# Patient Record
Sex: Female | Born: 1990 | Race: White | Hispanic: No | Marital: Married | Smoking: Never smoker
Health system: Southern US, Community
[De-identification: ages and names within clinical notes are randomized; demographics above are authoritative.]

## PROBLEM LIST (undated history)

## (undated) DIAGNOSIS — F419 Anxiety disorder, unspecified: Secondary | ICD-10-CM

## (undated) DIAGNOSIS — F32A Depression, unspecified: Secondary | ICD-10-CM

## (undated) DIAGNOSIS — F329 Major depressive disorder, single episode, unspecified: Secondary | ICD-10-CM

---

## 2016-09-01 ENCOUNTER — Emergency Department (HOSPITAL_COMMUNITY)
Admission: EM | Admit: 2016-09-01 | Discharge: 2016-09-01 | Disposition: A | Discharge status: Home / Self Care | Attending: Emergency Medicine | Admitting: Emergency Medicine

## 2016-09-01 ENCOUNTER — Encounter (HOSPITAL_COMMUNITY): Payer: Self-pay | Admitting: Emergency Medicine

## 2016-09-01 DIAGNOSIS — F419 Anxiety disorder, unspecified: Secondary | ICD-10-CM | POA: Diagnosis not present

## 2016-09-01 DIAGNOSIS — F339 Major depressive disorder, recurrent, unspecified: Secondary | ICD-10-CM | POA: Insufficient documentation

## 2016-09-01 DIAGNOSIS — F329 Major depressive disorder, single episode, unspecified: Secondary | ICD-10-CM | POA: Diagnosis present

## 2016-09-01 HISTORY — DX: Depression, unspecified: F32.A

## 2016-09-01 HISTORY — DX: Major depressive disorder, single episode, unspecified: F32.9

## 2016-09-01 HISTORY — DX: Anxiety disorder, unspecified: F41.9

## 2016-09-01 LAB — COMPREHENSIVE METABOLIC PANEL
ALBUMIN: 4.2 g/dL (ref 3.5–5.0)
ALK PHOS: 40 U/L (ref 38–126)
ALT: 17 U/L (ref 14–54)
ANION GAP: 8 (ref 5–15)
AST: 19 U/L (ref 15–41)
BILIRUBIN TOTAL: 0.4 mg/dL (ref 0.3–1.2)
BUN: 8 mg/dL (ref 6–20)
CALCIUM: 9.7 mg/dL (ref 8.9–10.3)
CO2: 27 mmol/L (ref 22–32)
Chloride: 106 mmol/L (ref 101–111)
Creatinine, Ser: 0.63 mg/dL (ref 0.44–1.00)
GFR calc Af Amer: >60 mL/min (ref >60)
Glucose, Bld: 94 mg/dL (ref 65–99)
POTASSIUM: 4.2 mmol/L (ref 3.5–5.1)
Sodium: 141 mmol/L (ref 135–145)
TOTAL PROTEIN: 7.4 g/dL (ref 6.5–8.1)

## 2016-09-01 LAB — URINALYSIS, ROUTINE W REFLEX MICROSCOPIC
Bilirubin Urine: NEGATIVE
GLUCOSE, UA: NEGATIVE mg/dL
Ketones, ur: NEGATIVE mg/dL
Nitrite: NEGATIVE
PH: 5 (ref 5.0–8.0)
Protein, ur: NEGATIVE mg/dL
SPECIFIC GRAVITY, URINE: 1.023 (ref 1.005–1.030)

## 2016-09-01 LAB — CBC WITH DIFFERENTIAL/PLATELET
BASOS PCT: 0 %
Basophils Absolute: 0 10*3/uL (ref 0.0–0.1)
Eosinophils Absolute: 0.2 10*3/uL (ref 0.0–0.7)
Eosinophils Relative: 2 %
HEMATOCRIT: 39 % (ref 36.0–46.0)
HEMOGLOBIN: 13 g/dL (ref 12.0–15.0)
LYMPHS PCT: 28 %
Lymphs Abs: 2.7 10*3/uL (ref 0.7–4.0)
MCH: 28.8 pg (ref 26.0–34.0)
MCHC: 33.3 g/dL (ref 30.0–36.0)
MCV: 86.5 fL (ref 78.0–100.0)
MONO ABS: 0.6 10*3/uL (ref 0.1–1.0)
MONOS PCT: 6 %
NEUTROS ABS: 6.2 10*3/uL (ref 1.7–7.7)
NEUTROS PCT: 64 %
Platelets: 257 10*3/uL (ref 150–400)
RBC: 4.51 MIL/uL (ref 3.87–5.11)
RDW: 12.2 % (ref 11.5–15.5)
WBC: 9.6 10*3/uL (ref 4.0–10.5)

## 2016-09-01 LAB — ACETAMINOPHEN LEVEL: Acetaminophen (Tylenol), Serum: <10 ug/mL — ABNORMAL LOW (ref 10–30)

## 2016-09-01 LAB — HCG, SERUM, QUALITATIVE: Preg, Serum: NEGATIVE

## 2016-09-01 LAB — ETHANOL: Alcohol, Ethyl (B): <5 mg/dL (ref <5)

## 2016-09-01 MED ORDER — HYDROXYZINE HCL 25 MG PO TABS: 25.0000 mg | ORAL_TABLET | Freq: Two times a day (BID) | ORAL | 0 refills | Status: DC | PRN

## 2016-09-01 MED ORDER — FLUOXETINE HCL 10 MG PO TABS: 10.0000 mg | ORAL_TABLET | Freq: Every day | ORAL | 0 refills | Status: DC

## 2016-09-01 NOTE — BH Assessment (Signed)
Tele Assessment Note   Patricia Church is an 26 y.o. female presenting to the ED at the request of her PCP after she cut herself in an act of self-mutilation. The patient had been taking Prozac until about two months ago, took herself off and since then has increased depression and anxiety. The anxiety has increased to panic attacks, which led to the cutting episode. She has a history of cutting in 05/21/2009 when a friend died.  The patient denies SI with plan or intent, states more thoughts of escapism and self loathing. Denies HI or A/V. Denies illicit drug use. The patient lives in [place] with her husband and 6 yr old twin girls. 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   Her PCP called in Effexor for the patient a week ago but this medication was not covered under her insurance. Has not followed with provider until today about alternative medication. The patient first had symptoms of anxiety while pregnant but never panic to the level that she experience yesterday.  She could not focus on anything, felt like someone was sitting on her chest and started crying. The patient has good support system, lives near her parents, in-laws and her grandmother.   The patient is not connected with a psychiatrist or a therapist.  Had therapy for brief situational issues in the past. Expressed willingness to attend therapy. This clinician indicated the importance of learning coping skills due to the increased stress of upcoming nursing classes. Had unremarkable appearance, good eye contact, was alert, depressed mood, coherent thought, unimpaired judgement, oriented x4, normal concentration, fair appetite and decreased sleep.   Jinny Blossom, NP recommends to restart medications, preferably Prozac if that was effective in the past. And, begin therapy as soon as possible.  This clinician spoke with the EDP. He requested referrals for outpatient providers be faxed over  to the ED. Faxed referrals to [phone]   Diagnosis: MDD, recurrent episode moderate, without psychosis; GAD   Past Medical History:  Past Medical History:  Diagnosis Date  . Anxiety   . Depression     Past Surgical History:  Procedure Laterality Date  . CESAREAN SECTION      Family History: No family history on file.  Social History:  reports that she has never smoked. She does not have any smokeless tobacco history on file. She reports that she drinks alcohol. She reports that she does not use drugs.  Additional Social History:  Alcohol / Drug Use Pain Medications: see MAR Prescriptions: see MAR Over the Counter: see MAR History of alcohol / drug use?: Yes Substance #1 Name of Substance 1: alcohol 1 - Age of First Use: UTA 1 - Amount (size/oz): a few drinks 1 - Frequency: every other weekend with friends at a gathering 1 - Duration: UTA 1 - Last Use / Amount: UTA  CIWA: CIWA-Ar BP: 123/71 Pulse Rate: 90 COWS:    PATIENT STRENGTHS: (choose at least two) Average or above average intelligence General fund of knowledge  Allergies: No Known Allergies  Home Medications:  (Not in a hospital admission)  OB/GYN Status:  Patient's last menstrual period was 08/19/2016.  General Assessment Data Location of Assessment: [facility] TTS Assessment: In system Is this a Tele or Face-to-Face Assessment?: Tele Assessment Is this an Initial Assessment or a Re-assessment for this encounter?: Initial Assessment Marital status: Married Is patient pregnant?: No Pregnancy Status: No Living Arrangements: Spouse/significant other Can pt return to current  living arrangement?: Yes Admission Status: Voluntary Is patient capable of signing voluntary admission?: Yes Referral Source: Self/Family/Friend Insurance type: [place] Screening Exam ([place]) Medical Exam completed: Yes  Crisis Care Plan Living Arrangements: Spouse/significant other Name of Psychiatrist:  n/a Name of Therapist: n/a  Education Status Is patient currently in school?: No Highest grade of school patient has completed: some college  Risk to self with the past 6 months Suicidal Ideation: Yes-Currently Present Has patient been a risk to self within the past 6 months prior to admission? : No Suicidal Intent: No Has patient had any suicidal intent within the past 6 months prior to admission? : No Is patient at risk for suicide?: No Suicidal Plan?: No Has patient had any suicidal plan within the past 6 months prior to admission? : No Access to Means: No What has been your use of drugs/alcohol within the last 12 months?: occasional use of alcohol  Previous Attempts/Gestures: No How many times?: 0 Other Self Harm Risks: 0 Intentional Self Injurious Behavior: Cutting Comment - Self Injurious Behavior: in the past, 2011 and most recently  Family Suicide History: Unknown Persecutory voices/beliefs?: No Depression: Yes Depression Symptoms: Insomnia, Feeling worthless/self pity Substance abuse history and/or treatment for substance abuse?: No Suicide prevention information given to non-admitted patients: Not applicable  Risk to Others within the past 6 months Homicidal Ideation: No Does patient have any lifetime risk of violence toward others beyond the six months prior to admission? : No Thoughts of Harm to Others: No Current Homicidal Intent: No Current Homicidal Plan: No Access to Homicidal Means: No History of harm to others?: No Assessment of Violence: None Noted Does patient have access to weapons?: No Criminal Charges Pending?: No Does patient have a court date: No Is patient on probation?: No  Psychosis Hallucinations: None noted Delusions: None noted  Mental Status Report Appearance/Hygiene: Unremarkable Eye Contact: Good Motor Activity: Freedom of movement Speech: Logical/coherent Level of Consciousness: Alert Mood: Depressed, Euthymic Affect:  Appropriate to circumstance Anxiety Level: Minimal Thought Processes: Coherent, Relevant Judgement: Unimpaired Orientation: Person, Place, Time, Situation Obsessive Compulsive Thoughts/Behaviors: None  Cognitive Functioning Concentration: Normal Memory: Recent Intact, Remote Intact IQ: Average Insight: Good Impulse Control: Fair Appetite: Fair Weight Loss: 0 Weight Gain: 0 Sleep: Decreased Total Hours of Sleep: 5 Vegetative Symptoms: None (would if she didnt have kids- stay in bed)  ADLScreening ([place]) Patient's cognitive ability adequate to safely complete daily activities?: Yes Patient able to express need for assistance with ADLs?: Yes Independently performs ADLs?: Yes (appropriate for developmental age)  Prior Inpatient Therapy Prior Inpatient Therapy: No  Prior Outpatient Therapy Prior Outpatient Therapy: Yes Prior Therapy Dates: limited, situational visits Prior Therapy Facilty/Provider(s): unknown  Reason for Treatment: loss, marriage therapy Does patient have an ACCT team?: No Does patient have Intensive In-House Services?  : No Does patient have [facility] services? : No Does patient have P4CC services?: No  ADL Screening (condition at time of admission) Patient's cognitive ability adequate to safely complete daily activities?: Yes Is the patient deaf or have difficulty hearing?: No Does the patient have difficulty seeing, even when wearing glasses/contacts?: No Does the patient have difficulty concentrating, remembering, or making decisions?: No Patient able to express need for assistance with ADLs?: Yes Does the patient have difficulty dressing or bathing?: No Independently performs ADLs?: Yes (appropriate for developmental age)       Abuse/Neglect Assessment (Assessment to be complete while patient is alone) Physical Abuse:  (UTA) Verbal Abuse:  (  UTA) Sexual Abuse:  (UTA)     Advance Directives (For Healthcare) Does Patient Have a  Medical Advance Directive?: No Would patient like information on creating a medical advance directive?: No - Patient declined    Additional Information 1:1 In Past 12 Months?: No CIRT Risk: No Elopement Risk: No Does patient have medical clearance?: Yes     Disposition:  Disposition Initial Assessment Completed for this Encounter: Yes Disposition of Patient: Outpatient treatment Type of outpatient treatment: Adult  [place] 09/01/2016 7:09 PM

## 2016-09-01 NOTE — ED Triage Notes (Signed)
Pt reports being off of her medications for depression and has been feeling very down and has been having panic attacks.  States she felt that she could come off of the medication is the reason she is no longer taking it.  Talked with her pcp who prescribed something else, but her insurance will not cover it.  This was 1 week ago.  States she cut her upper arm yesterday, but was not trying to kill self.  States she has thoughts of her family being better off without her, but not thoughts of killing herself.

## 2016-09-01 NOTE — Discharge Instructions (Signed)
Call one of the outpatient counseling centers recommended by the behavioral health evaluator. Start Prozac (Fluoxetine). Some people's depression can worsen when starting an antidepressant. Talk to your family/husband/counselor daily to assess your symptoms. At any time you feel that you are worsening, or feel at risk for suicide, return for re-evaluation. Hydroxyzine for anxiety.

## 2016-09-01 NOTE — ED Provider Notes (Signed)
[facility] DEPT Provider Note   CSN: [id number] Arrival date & time: 09/01/16  1506     History   Chief Complaint Chief Complaint  Patient presents with  . V70.1    HPI Patricia Church is a 26 y.o. female.Chief co plaint:  Depression, and anxiety  HPI 26 year old female with history of depression and anxiety. First started on antidepressants as a teenager when her best friend was killed. His never gone through counseling. States she was on Prozac for several years. It worked for her depression. At time she felt it may be making her anxiety worse. This was discontinued. She was placed on Zoloft in various doses which she felt never worked well for her.  She states that she has been depressed over the last several weeks. She does have some anxiety issues scheduled to start nursing school in about 1 month.  He states that over the last few days she's had more anxiety. She scratched her self on her arm a few days ago she states this was "just to feel some pain". She denies that this was a suicidal attempt. She was seen by her primary care physician last week and prescribed Effexor but [employer] insurance would not cover it.  S is a lot of concern about her young twins. She says with confidence, and her husband reiterates that they do not feel she is at risk to herself but wanted "to talk about something different.  Past Medical History:  Diagnosis Date  . Anxiety   . Depression     There are no active problems to display for this patient.   Past Surgical History:  Procedure Laterality Date  . CESAREAN SECTION      OB History    No data available       Home Medications    Prior to Admission medications   Medication Sig Start Date End Date Taking? Authorizing Provider  ibuprofen (ADVIL,MOTRIN) 200 MG tablet Take 200-400 mg by mouth every 6 (six) hours as needed for fever, headache or moderate pain.   Yes [provider]  levonorgestrel (MIRENA) 20 MCG/24HR  IUD 1 each by Intrauterine route once.   Yes [provider]  oxymetazoline (AFRIN NASAL SPRAY) 0.05 % nasal spray Place 1-2 sprays into both nostrils daily as needed for congestion.   Yes [provider]  FLUoxetine (PROZAC) 10 MG tablet Take 1 tablet (10 mg total) by mouth daily. 09/01/16   Tanna Furry, MD  hydrOXYzine (ATARAX/VISTARIL) 25 MG tablet Take 1 tablet (25 mg total) by mouth every 12 (twelve) hours as needed for anxiety. 09/01/16   Tanna Furry, MD    Family History No family history on file.  Social History Social History  Substance Use Topics  . Smoking status: Never Smoker  . Smokeless tobacco: Not on file  . Alcohol use Yes     Comment: occasional     Allergies   Patient has no known allergies.   Review of Systems Review of Systems  Constitutional: Negative for appetite change, chills, diaphoresis, fatigue and fever.  HENT: Negative for mouth sores, sore throat and trouble swallowing.   Eyes: Negative for visual disturbance.  Respiratory: Negative for cough, chest tightness, shortness of breath and wheezing.   Cardiovascular: Negative for chest pain.  Gastrointestinal: Negative for abdominal distention, abdominal pain, diarrhea, nausea and vomiting.  Endocrine: Negative for polydipsia, polyphagia and polyuria.  Genitourinary: Negative for dysuria, frequency and hematuria.  Musculoskeletal: Negative for gait problem.  Skin: Negative for color  change, pallor and rash.  Neurological: Negative for dizziness, syncope, light-headedness and headaches.  Hematological: Does not bruise/bleed easily.  Psychiatric/Behavioral: Positive for dysphoric mood and sleep disturbance. Negative for behavioral problems and confusion. The patient is nervous/anxious.      Physical Exam Updated Vital Signs BP 123/71 (BP Location: Right Arm)   Pulse 90   Temp 98 F (36.7 C) (Oral)   Resp 16   Ht 5\' 2"  (1.575 m)   Wt 65.8 kg (145 lb)   LMP 08/19/2016   SpO2 100%    BMI 26.52 kg/m   Physical Exam  Constitutional: She is oriented to person, place, and time. She appears well-developed and well-nourished. No distress.  HENT:  Head: Normocephalic.  Eyes: Pupils are equal, round, and reactive to light. Conjunctivae are normal. No scleral icterus.  Neck: Normal range of motion. Neck supple. No thyromegaly present.  Cardiovascular: Normal rate and regular rhythm.  Exam reveals no gallop and no friction rub.   No murmur heard. Pulmonary/Chest: Effort normal and breath sounds normal. No respiratory distress. She has no wheezes. She has no rales.  Abdominal: Soft. Bowel sounds are normal. She exhibits no distension. There is no tenderness. There is no rebound.  Musculoskeletal: Normal range of motion.  Neurological: She is alert and oriented to person, place, and time.  Skin: Skin is warm and dry. No rash noted.  Psychiatric: She has a normal mood and affect. Her behavior is normal.  Appropriate interaction. Good eye contact. Admits to poor sleep and poor appetite.     ED Treatments / Results  Labs (all labs ordered are listed, but only abnormal results are displayed) Labs Reviewed  ACETAMINOPHEN LEVEL - Abnormal; Notable for the following:       Result Value   Acetaminophen (Tylenol), Serum <10 (*)    All other components within normal limits  URINALYSIS, ROUTINE W REFLEX MICROSCOPIC - Abnormal; Notable for the following:    APPearance HAZY (*)    Hgb urine dipstick SMALL (*)    Leukocytes, UA TRACE (*)    Bacteria, UA RARE (*)    Squamous Epithelial / LPF 0-5 (*)    All other components within normal limits  CBC WITH DIFFERENTIAL/PLATELET  COMPREHENSIVE METABOLIC PANEL  HCG, SERUM, QUALITATIVE  ETHANOL    EKG  EKG Interpretation None       Radiology No results found.  Procedures Procedures (including critical care time)  Medications Ordered in ED Medications - No data to display   Initial Impression / Assessment and Plan /  ED Course  I have reviewed the triage vital signs and the nursing notes.  Pertinent labs & imaging results that were available during my care of the patient were reviewed by me and considered in my medical decision making (see chart for details).   per behavioral health evaluation no indication for psychiatric admission. I would agree that the patient does not seem eminent for self-harm. They recommended that she restart an antidepressant. I discussed this with her at length. She remembers doing well on Prozac several years ago other than some anxiety. We'll place her on Prozac. Today with her that some people can worsen depressive symptoms in the first week or 2. Asked her to have daily contact with her family, husband, counselor, her primary care physician. I asked her reevaluated me any worsening symptoms. This was expressed as understood concern by patient and husband. Will use hydroxyzine as needed but sparingly for anxiety. Astra contact her primary care physician  to arrange follow-up appointment. Multiple outpatient counseling centers were provided in the area by mouth. She was asked to contact one for evaluation.  Final Clinical Impressions(s) / ED Diagnoses   Final diagnoses:  Recurrent major depressive disorder, remission status unspecified (HCC)  Anxiety    New Prescriptions New Prescriptions   FLUOXETINE (PROZAC) 10 MG TABLET    Take 1 tablet (10 mg total) by mouth daily.   HYDROXYZINE (ATARAX/VISTARIL) 25 MG TABLET    Take 1 tablet (25 mg total) by mouth every 12 (twelve) hours as needed for anxiety.     Tanna Furry, MD 09/01/16 [phone]

## 2018-06-23 ENCOUNTER — Other Ambulatory Visit: Payer: Self-pay | Admitting: Certified Nurse Midwife

## 2018-06-23 ENCOUNTER — Other Ambulatory Visit (HOSPITAL_COMMUNITY): Payer: Self-pay | Admitting: Certified Nurse Midwife

## 2018-06-23 DIAGNOSIS — E221 Hyperprolactinemia: Secondary | ICD-10-CM

## 2018-06-29 ENCOUNTER — Ambulatory Visit (HOSPITAL_COMMUNITY)
Admission: RE | Admit: 2018-06-29 | Discharge: 2018-06-29 | Disposition: A | Discharge status: Home / Self Care | Source: Ambulatory Visit | Attending: Certified Nurse Midwife | Admitting: Certified Nurse Midwife

## 2018-06-29 ENCOUNTER — Other Ambulatory Visit: Payer: Self-pay

## 2018-06-29 DIAGNOSIS — E221 Hyperprolactinemia: Secondary | ICD-10-CM | POA: Diagnosis present

## 2018-06-29 MED ORDER — GADOBUTROL 1 MMOL/ML IV SOLN
6.0000 mL | Freq: Once | INTRAVENOUS | Status: AC | PRN
  Administered 2018-06-29: 6 mL via INTRAVENOUS

## 2018-09-05 ENCOUNTER — Ambulatory Visit (INDEPENDENT_AMBULATORY_CARE_PROVIDER_SITE_OTHER): Admitting: "Endocrinology

## 2018-09-05 ENCOUNTER — Other Ambulatory Visit: Payer: Self-pay

## 2018-09-05 ENCOUNTER — Encounter: Payer: Self-pay | Admitting: "Endocrinology

## 2018-09-05 VITALS — BP 101/71 | HR 85 | Ht 62.0 in | Wt 126.0 lb

## 2018-09-05 DIAGNOSIS — D352 Benign neoplasm of pituitary gland: Secondary | ICD-10-CM

## 2018-09-05 NOTE — Progress Notes (Signed)
Endocrinology Consult Note                                            09/05/2018, 7:15 PM   Subjective:    Patient ID: Patricia Church, female    DOB: October 28, 1990, PCP Practice, [facility]   Past Medical History:  Diagnosis Date  . Anxiety   . Depression    Past Surgical History:  Procedure Laterality Date  . CESAREAN SECTION     Social History   Socioeconomic History  . Marital status: Married    Spouse name: Not on file  . Number of children: Not on file  . Years of education: Not on file  . Highest education level: Not on file  Occupational History  . Not on file  Social Needs  . Financial resource strain: Not on file  . Food insecurity    Worry: Not on file    Inability: Not on file  . Transportation needs    Medical: Not on file    Non-medical: Not on file  Tobacco Use  . Smoking status: Never Smoker  . Smokeless tobacco: Never Used  Substance and Sexual Activity  . Alcohol use: Yes    Comment: occasional  . Drug use: No  . Sexual activity: Yes    Birth control/protection: I.U.D.  Lifestyle  . Physical activity    Days per week: Not on file    Minutes per session: Not on file  . Stress: Not on file  Relationships  . Social Herbalist on phone: Not on file    Gets together: Not on file    Attends religious service: Not on file    Active member of club or organization: Not on file    Attends meetings of clubs or organizations: Not on file    Relationship status: Not on file  Other Topics Concern  . Not on file  Social History Narrative  . Not on file   History reviewed. No pertinent family history. Outpatient Encounter Medications as of 09/05/2018  Medication Sig  . venlafaxine XR (EFFEXOR-XR) 150 MG 24 hr capsule Take by mouth daily.  . clonazePAM (KLONOPIN) 0.5 MG tablet 0.5 mg as needed.  . [DISCONTINUED] FLUoxetine (PROZAC) 10 MG tablet Take 1 tablet (10 mg total) by mouth daily.  . [DISCONTINUED] hydrOXYzine  (ATARAX/VISTARIL) 25 MG tablet Take 1 tablet (25 mg total) by mouth every 12 (twelve) hours as needed for anxiety.  . [DISCONTINUED] ibuprofen (ADVIL,MOTRIN) 200 MG tablet Take 200-400 mg by mouth every 6 (six) hours as needed for fever, headache or moderate pain.  . [DISCONTINUED] levonorgestrel (MIRENA) 20 MCG/24HR IUD 1 each by Intrauterine route once.  . [DISCONTINUED] oxymetazoline (AFRIN NASAL SPRAY) 0.05 % nasal spray Place 1-2 sprays into both nostrils daily as needed for congestion.   No facility-administered encounter medications on file as of 09/05/2018.    ALLERGIES: Allergies  Allergen Reactions  . Bupropion Rash    VACCINATION STATUS:  There is no immunization history on file for this patient.  HPI Patricia Church is 28 y.o. female who presents today with a medical history as above. she is being seen in consultation for pituitary microadenoma requested by her OB/GYN provider Dr. Santo Held. -She reports that she was initiated on Effexor 150 mg once daily for mood disorder 18 months ago.  Subsequently she started to have galactorrhea.  Not available to review, however she was also found to have hyperprolactinemia.  This led to MRI imaging of the brain which showed 5 mm pituitary adenoma. She noticed that the galactorrhea is decreasing and not spontaneous at this time.  She denies any prior thyroid dysfunction.  She underwent C-section in September 2016 with twin pregnancy.  She is planning another pregnancy in the future.  She is not actively working on it now. -She complains of heavy menstrual flow, however her menstrual cycles not regular.  She is working currently with her OB/GYN.  She reports some headache, no complaint of visual field deficit.   Review of Systems  Constitutional: no recent weight gain/loss, no fatigue, no subjective hyperthermia, no subjective hypothermia Eyes: no blurry vision, no xerophthalmia ENT: no sore throat, no nodules palpated in throat,  no dysphagia/odynophagia, no hoarseness Cardiovascular: no Chest Pain, no Shortness of Breath, no palpitations, no leg swelling Respiratory: no cough, no shortness of breath Gastrointestinal: no Nausea/Vomiting/Diarhhea Musculoskeletal: no muscle/joint aches Skin: no rashes Neurological: no tremors, no numbness, no tingling, no dizziness Psychiatric: no depression, no anxiety  Objective:    BP 101/71   Pulse 85   Ht 5\' 2"  (1.575 m)   Wt 126 lb (57.2 kg)   BMI 23.05 kg/m   Wt Readings from Last 3 Encounters:  09/05/18 126 lb (57.2 kg)  09/01/16 145 lb (65.8 kg)    Physical Exam  Constitutional: + BMI of 23,  not in acute distress, normal state of mind Eyes: PERRLA, EOMI, no exophthalmos ENT: moist mucous membranes, no gross thyromegaly, no gross cervical lymphadenopathy Cardiovascular: normal precordial activity, Regular Rate and Rhythm, no Murmur/Rubs/Gallops Respiratory:  adequate breathing efforts, no gross chest deformity, Clear to auscultation bilaterally Gastrointestinal: abdomen soft, Non -tender, No distension, Bowel Sounds present, no gross organomegaly Musculoskeletal: no gross deformities, strength intact in all four extremities Skin: moist, warm, no rashes Breast exam deferred until we get new set of labs for prolactin. Neurological: no tremor with outstretched hands, Deep tendon reflexes normal in bilateral lower extremities.   MRI of brain with and without contrast on Jun 29, 2018 ADDENDUM: Upon further consideration and review, there is a subtle soft tissue lesion measuring 5 x 3 x 3 mm positioned at the right superior aspect of the pituitary gland, at the superior and medial aspect of the right cavernous sinus. This is best appreciated on coronal T2 weighted sequence (series 7, image 17). Following contrast administration, lesion demonstrates relative hypoenhancement as compared to the adjacent gland (series 1202, image 2). Finding felt to be most consistent  with a small pituitary microadenoma, somewhat unusual in location at the margin of the gland itself. No invasion into the adjacent cavernous sinus. Lesion closely approximates the undersurface of the right optic chiasm without mass effect. Pituitary stalk is midline.   Electronically Signed   By: Jeannine Boga M.D.   On: 06/30/2018 00:17  Assessment & Plan:   1. Pituitary microadenoma [facility]) - Deandre Stansel  is being seen at a kind request of Practice, [facility]. - I have reviewed her available endocrine records and clinically evaluated the patient. - Based on these reviews, she has pituitary microadenoma.  This microadenoma will not need any surgical intervention at this time. -It is unlikely to be a source of significant hyperprolactinemia in her case .  - she will need a repeat,  more complete evaluation with repeat prolactin, thyroid function tests towards assessing the functional  status of the pituitary microadenoma.     If she continues to have hyperprolactinemia, she will be  treated with cabergoline.   - I did not initiate any new prescriptions today.  -She will be sent to lab for these labs:  TSH - T4, free - Prolactin - CBC with Differential/Platelet  - Time spent with the patient: 45 minutes, of which >50% was spent in obtaining information about her symptoms, reviewing her previous labs/studies,  evaluations, and treatments, counseling her about her pituitary microadenoma and history of hyperprolactinemia, and developing a plan to confirm the diagnosis and long term treatment based on the latest standards of care/guidelines.    Patricia Church participated in the discussions, expressed understanding, and voiced agreement with the above plans.  All questions were answered to her satisfaction. she is encouraged to contact clinic should she have any questions or concerns prior to her return visit.  Follow up plan: Return in about 10 days (around  09/15/2018), or labs today, also get her labs from Ob/Gyn, for Follow up with Pre-visit Labs.   Glade Lloyd, MD [facility] [address] [place], [place] [address] Phone: [phone]  Fax: [phone]     09/05/2018, 7:15 PM  This note was partially dictated with voice recognition software. Similar sounding words can be transcribed inadequately or may not  be corrected upon review.

## 2018-09-06 LAB — CBC WITH DIFFERENTIAL/PLATELET
Absolute Monocytes: 462 cells/uL (ref 200–950)
Basophils Absolute: 28 cells/uL (ref 0–200)
Basophils Relative: 0.4 %
Eosinophils Absolute: 189 cells/uL (ref 15–500)
Eosinophils Relative: 2.7 %
HCT: 38.3 % (ref 35.0–45.0)
Hemoglobin: 12.9 g/dL (ref 11.7–15.5)
Lymphs Abs: 2471 cells/uL (ref 850–3900)
MCH: 28.6 pg (ref 27.0–33.0)
MCHC: 33.7 g/dL (ref 32.0–36.0)
MCV: 84.9 fL (ref 80.0–100.0)
MPV: 10.9 fL (ref 7.5–12.5)
Monocytes Relative: 6.6 %
Neutro Abs: 3850 cells/uL (ref 1500–7800)
Neutrophils Relative %: 55 %
Platelets: 258 10*3/uL (ref 140–400)
RBC: 4.51 10*6/uL (ref 3.80–5.10)
RDW: 12.4 % (ref 11.0–15.0)
Total Lymphocyte: 35.3 %
WBC: 7 10*3/uL (ref 3.8–10.8)

## 2018-09-06 LAB — T4, FREE: Free T4: 0.9 ng/dL (ref 0.8–1.8)

## 2018-09-06 LAB — TSH: TSH: 1.83 mIU/L

## 2018-09-06 LAB — PROLACTIN: Prolactin: 8.2 ng/mL

## 2018-09-19 ENCOUNTER — Encounter: Payer: Self-pay | Admitting: "Endocrinology

## 2018-09-19 ENCOUNTER — Other Ambulatory Visit: Payer: Self-pay

## 2018-09-19 ENCOUNTER — Ambulatory Visit (INDEPENDENT_AMBULATORY_CARE_PROVIDER_SITE_OTHER): Admitting: "Endocrinology

## 2018-09-19 VITALS — BP 112/76 | HR 98 | Wt 125.0 lb

## 2018-09-19 DIAGNOSIS — D352 Benign neoplasm of pituitary gland: Secondary | ICD-10-CM | POA: Diagnosis not present

## 2018-09-19 NOTE — Progress Notes (Signed)
09/19/2018, 5:36 PM  Endocrinology follow-up note   Subjective:    Patient ID: Patricia Church, female    DOB: August 14, 1990, PCP Practice, [facility]   Past Medical History:  Diagnosis Date  . Anxiety   . Depression    Past Surgical History:  Procedure Laterality Date  . CESAREAN SECTION     Social History   Socioeconomic History  . Marital status: Married    Spouse name: Not on file  . Number of children: Not on file  . Years of education: Not on file  . Highest education level: Not on file  Occupational History  . Not on file  Social Needs  . Financial resource strain: Not on file  . Food insecurity    Worry: Not on file    Inability: Not on file  . Transportation needs    Medical: Not on file    Non-medical: Not on file  Tobacco Use  . Smoking status: Never Smoker  . Smokeless tobacco: Never Used  Substance and Sexual Activity  . Alcohol use: Yes    Comment: occasional  . Drug use: No  . Sexual activity: Yes    Birth control/protection: I.U.D.  Lifestyle  . Physical activity    Days per week: Not on file    Minutes per session: Not on file  . Stress: Not on file  Relationships  . Social Herbalist on phone: Not on file    Gets together: Not on file    Attends religious service: Not on file    Active member of club or organization: Not on file    Attends meetings of clubs or organizations: Not on file    Relationship status: Not on file  Other Topics Concern  . Not on file  Social History Narrative  . Not on file   History reviewed. No pertinent family history. Outpatient Encounter Medications as of 09/19/2018  Medication Sig  . clonazePAM (KLONOPIN) 0.5 MG tablet 0.5 mg as needed.  . venlafaxine XR (EFFEXOR-XR) 150 MG 24 hr capsule Take by mouth daily.   No facility-administered encounter medications on file as of 09/19/2018.    ALLERGIES: Allergies  Allergen Reactions  .  Bupropion Rash    VACCINATION STATUS:  There is no immunization history on file for this patient.  HPI Patricia Church is 28 y.o. female who presents today with a medical history as above. she is returning for follow-up with repeat labs after she was seen in consultation for pituitary microadenoma requested by her OB/GYN provider Dr. Santo Held. -She reports that she was initiated on Effexor 150 mg once daily for mood disorder 18 months ago.  Subsequently she started to have galactorrhea.  Not available to review, however she was also found to have normal breast ultrasound.  Review of her records show that highest prolactin level she had was actually normal at 20.8. Her repeat labs show prolactin level of 8.2 and normal thyroid function test.  -Prior to her last visit, she underwent MRI imaging of the brain which showed 5 mm pituitary adenoma. She noticed that the galactorrhea is decreasing and not spontaneous at this time.  She denies any prior thyroid dysfunction.  She underwent C-section in September  2016 with twin pregnancy.  She is planning another pregnancy in the near  future.  She is not actively working on it now. -She complains of heavy menstrual flow, however her menstrual cycles not regular.  She is working currently with her OB/GYN.   She reports some headache, no complaint of visual field deficit.   Review of Systems  Constitutional: no recent weight gain/loss, no fatigue, no subjective hyperthermia, no subjective hypothermia Eyes: no blurry vision, no xerophthalmia ENT: no sore throat, no nodules palpated in throat, no dysphagia/odynophagia, no hoarseness Musculoskeletal: no muscle/joint aches Skin: no rashes Neurological: no tremors, no numbness, no tingling, no dizziness Psychiatric: no depression, no anxiety  Objective:    BP 112/76   Pulse 98   Wt 125 lb (56.7 kg)   BMI 22.86 kg/m   Wt Readings from Last 3 Encounters:  09/19/18 125 lb (56.7 kg)  09/05/18  126 lb (57.2 kg)  09/01/16 145 lb (65.8 kg)    Physical Exam- Limited Constitutional: + BMI of 23,  not in acute distress, she is distressed because of acute illness of her grandfather who was just airlifted because of atrial fibrillation.    Eyes:  EOMI, no exophthalmos Neck: Supple Respiratory: Adequate breathing efforts Musculoskeletal: no gross deformities, strength intact in all four extremities, no gross restriction of joint movements Skin:  no rashes, no hyperemia Neurological: no tremor with outstretched hands.    MRI of brain with and without contrast on Jun 29, 2018 ADDENDUM: Upon further consideration and review, there is a subtle soft tissue lesion measuring 5 x 3 x 3 mm positioned at the right superior aspect of the pituitary gland, at the superior and medial aspect of the right cavernous sinus. This is best appreciated on coronal T2 weighted sequence (series 7, image 17). Following contrast administration, lesion demonstrates relative hypoenhancement as compared to the adjacent gland (series 1202, image 2). Finding felt to be most consistent with a small pituitary microadenoma, somewhat unusual in location at the margin of the gland itself. No invasion into the adjacent cavernous sinus. Lesion closely approximates the undersurface of the right optic chiasm without mass effect. Pituitary stalk is midline.   Electronically Signed   By: Jeannine Boga M.D.   On: 06/30/2018 00:17  Recent Results (from the past 2160 hour(s))  TSH     Status: None   Collection Time: 09/05/18  3:27 PM  Result Value Ref Range   TSH 1.83 mIU/L    Comment:           Reference Range .           > or = 20 Years  0.40-4.50 .                Pregnancy Ranges           First trimester    0.26-2.66           Second trimester   0.55-2.73           Third trimester    0.43-2.91   T4, free     Status: None   Collection Time: 09/05/18  3:27 PM  Result Value Ref Range   Free T4 0.9  0.8 - 1.8 ng/dL  Prolactin     Status: None   Collection Time: 09/05/18  3:27 PM  Result Value Ref Range   Prolactin 8.2 ng/mL    Comment:             Reference Range  Females  Non-pregnant        3.0-30.0         Pregnant           10.0-209.0         Postmenopausal      2.0-20.0 . . .   CBC with Differential/Platelet     Status: None   Collection Time: 09/05/18  3:27 PM  Result Value Ref Range   WBC 7.0 3.8 - 10.8 Thousand/uL   RBC 4.51 3.80 - 5.10 Million/uL   Hemoglobin 12.9 11.7 - 15.5 g/dL   HCT 38.3 35.0 - 45.0 %   MCV 84.9 80.0 - 100.0 fL   MCH 28.6 27.0 - 33.0 pg   MCHC 33.7 32.0 - 36.0 g/dL   RDW 12.4 11.0 - 15.0 %   Platelets 258 140 - 400 Thousand/uL   MPV 10.9 7.5 - 12.5 fL   Neutro Abs 3,850 1,500 - 7,800 cells/uL   Lymphs Abs 2,471 850 - 3,900 cells/uL   Absolute Monocytes 462 200 - 950 cells/uL   Eosinophils Absolute 189 15 - 500 cells/uL   Basophils Absolute 28 0 - 200 cells/uL   Neutrophils Relative % 55 %   Total Lymphocyte 35.3 %   Monocytes Relative 6.6 %   Eosinophils Relative 2.7 %   Basophils Relative 0.4 %     Assessment & Plan:   1. Pituitary microadenoma ([place])   This microadenoma will not need any surgical intervention at this time. -It is unlikely to be a source of significant hyperprolactinemia in her case .  Her repeat labs show normal prolactin of 8.2, and review of her prior labs show high prolactin she had was still normal at 20.8. -She will not need therapy with dopamine receptor agonists at this time.   -Her CBC is normal.  From endocrine point of view, she is clear for her planned pregnancy.  - I did not initiate any new prescriptions today.  She will need repeat labs for prolactin and thyroid function test in 1 year.  Follow up plan: Return in about 1 year (around 09/19/2019) for Follow up with Pre-visit Labs.   Glade Lloyd, MD [facility] [address] [place],  [address] Phone: [phone]  Fax: [phone]     09/19/2018, 5:36 PM  This note was partially dictated with voice recognition software. Similar sounding words can be transcribed inadequately or may not  be corrected upon review.

## 2018-12-19 ENCOUNTER — Other Ambulatory Visit: Payer: Self-pay | Admitting: Urology

## 2018-12-26 ENCOUNTER — Encounter (HOSPITAL_COMMUNITY): Admission: RE | Admit: 2018-12-26 | Source: Ambulatory Visit

## 2018-12-26 ENCOUNTER — Other Ambulatory Visit (HOSPITAL_COMMUNITY)

## 2018-12-29 ENCOUNTER — Ambulatory Visit: Admit: 2018-12-29 | Admitting: Urology

## 2018-12-29 SURGERY — CYSTOSCOPY/URETEROSCOPY/HOLMIUM LASER/STENT PLACEMENT
Anesthesia: General | Laterality: Right

## 2019-09-20 ENCOUNTER — Ambulatory Visit: Admitting: "Endocrinology

## 2020-05-20 ENCOUNTER — Other Ambulatory Visit: Payer: Self-pay

## 2020-05-20 DIAGNOSIS — D352 Benign neoplasm of pituitary gland: Secondary | ICD-10-CM

## 2020-08-20 ENCOUNTER — Telehealth: Payer: Self-pay | Admitting: "Endocrinology

## 2020-08-20 DIAGNOSIS — D352 Benign neoplasm of pituitary gland: Secondary | ICD-10-CM

## 2020-08-20 NOTE — Telephone Encounter (Signed)
Pt called and has not been seen in 2 years but is requesting blood work orders be sent to [facility]

## 2020-08-20 NOTE — Telephone Encounter (Signed)
Order sent.
